# Patient Record
Sex: Male | Born: 1955 | Race: Black or African American | Hispanic: No | Marital: Single
Health system: Southern US, Community
[De-identification: ages and names within clinical notes are randomized; demographics above are authoritative.]

## PROBLEM LIST (undated history)

## (undated) ENCOUNTER — Ambulatory Visit (HOSPITAL_COMMUNITY): Admission: EM | Payer: Self-pay | Source: Home / Self Care

---

## 1999-03-02 ENCOUNTER — Encounter: Admission: RE | Admit: 1999-03-02 | Discharge: 1999-04-05 | Payer: Self-pay | Admitting: *Deleted

## 1999-04-08 ENCOUNTER — Encounter: Admission: RE | Admit: 1999-04-08 | Discharge: 1999-04-08 | Payer: Self-pay | Admitting: *Deleted

## 1999-04-08 ENCOUNTER — Encounter: Payer: Self-pay | Admitting: *Deleted

## 1999-04-21 ENCOUNTER — Encounter: Admission: RE | Admit: 1999-04-21 | Discharge: 1999-05-04 | Payer: Self-pay | Admitting: *Deleted

## 2003-10-16 ENCOUNTER — Emergency Department (HOSPITAL_COMMUNITY): Admission: EM | Admit: 2003-10-16 | Discharge: 2003-10-16 | Payer: Self-pay | Admitting: Emergency Medicine

## 2003-11-13 ENCOUNTER — Ambulatory Visit (HOSPITAL_COMMUNITY): Admission: RE | Admit: 2003-11-13 | Discharge: 2003-11-14 | Payer: Self-pay | Admitting: Neurosurgery

## 2005-01-09 ENCOUNTER — Emergency Department (HOSPITAL_COMMUNITY): Admission: EM | Admit: 2005-01-09 | Discharge: 2005-01-09 | Payer: Self-pay | Admitting: Emergency Medicine

## 2005-08-30 ENCOUNTER — Emergency Department (HOSPITAL_COMMUNITY): Admission: EM | Admit: 2005-08-30 | Discharge: 2005-08-30 | Payer: Self-pay | Admitting: Family Medicine

## 2006-12-17 ENCOUNTER — Emergency Department (HOSPITAL_COMMUNITY): Admission: EM | Admit: 2006-12-17 | Discharge: 2006-12-17 | Payer: Self-pay | Admitting: Family Medicine

## 2007-03-22 ENCOUNTER — Ambulatory Visit: Payer: Self-pay | Admitting: Pulmonary Disease

## 2007-03-22 ENCOUNTER — Emergency Department (HOSPITAL_COMMUNITY): Admission: EM | Admit: 2007-03-22 | Discharge: 2007-03-23 | Payer: Self-pay | Admitting: Emergency Medicine

## 2008-07-06 DEATH — deceased

## 2009-08-15 IMAGING — CT CT CHEST W/ CM
2 of 3 series · 15 of 36 positions shown, 18 images · IV contrast (APPLIED)
Comparison: No prior chest CT. Chest x-ray earlier today correlated.

CLINICAL DATA: Cough, fever. Abnormal chest x-ray earlier.

CT CHEST WITH CONTRAST  03/22/2007:
TECHNIQUE: Multidetector CT imaging of the chest and upper abdomen was
performed during bolus administration of intravenous contrast.
Contrast:  100 cc Omnipaque 300.

[Series 2: routine chest 5.0 st · axial · 0.65mm/px · z∈[-284,+1]mm · 12 of 69 slices shown, 15 images]
[im 6/69  mediastinal]
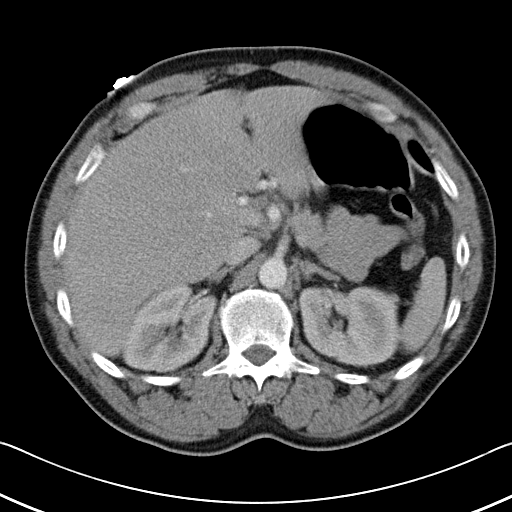
[im 6/69  lung]
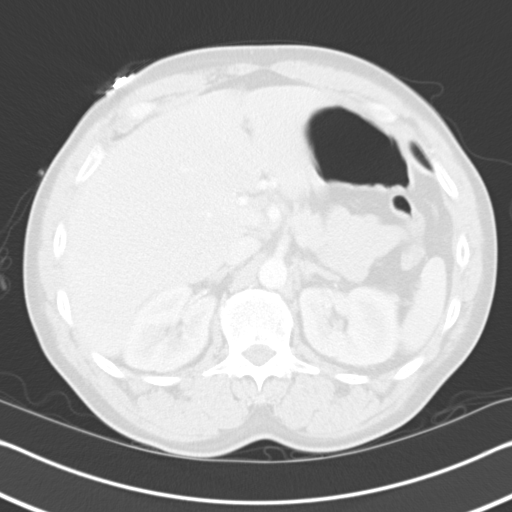
[im 11/69  lung]
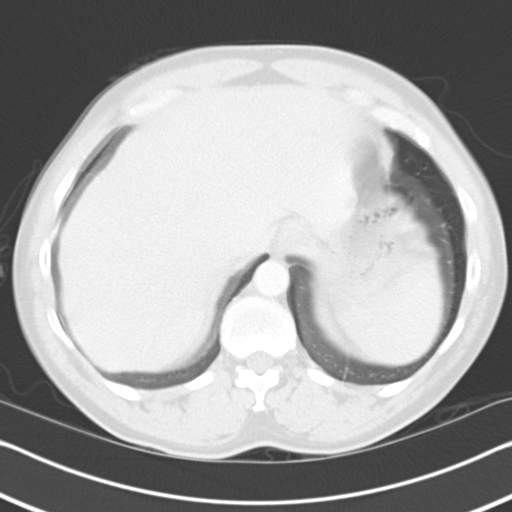
[im 16/69  lung]
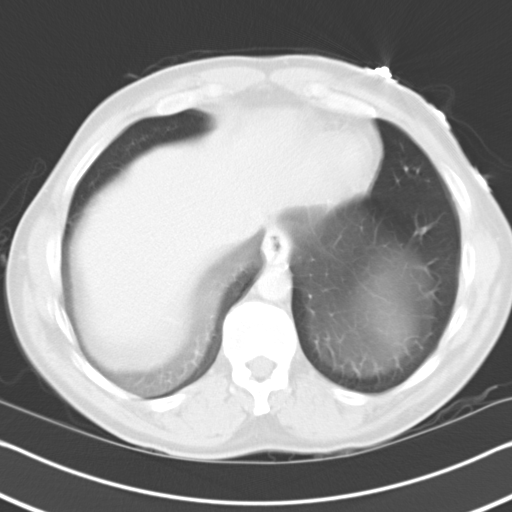
[im 21/69  lung]
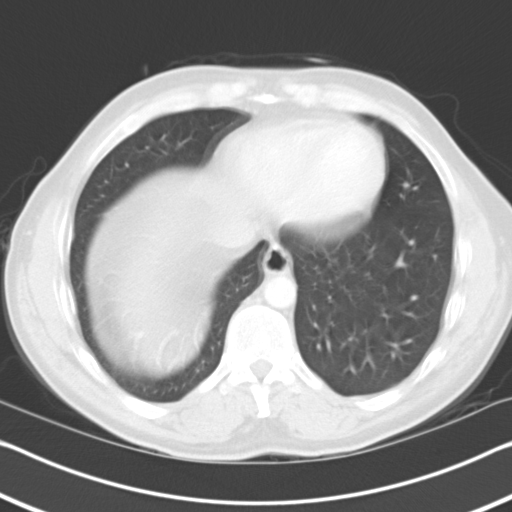
[im 26/69  mediastinal]
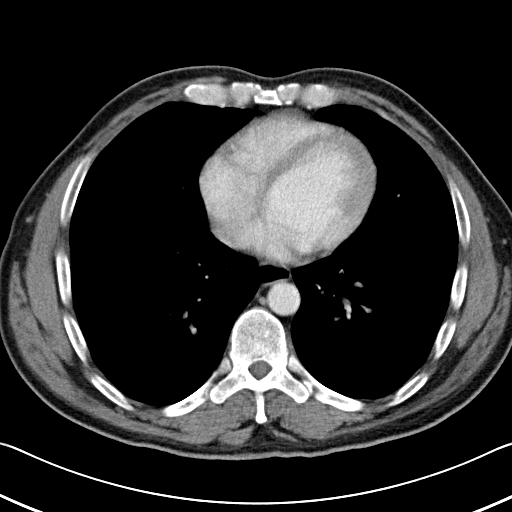
[im 26/69  lung]
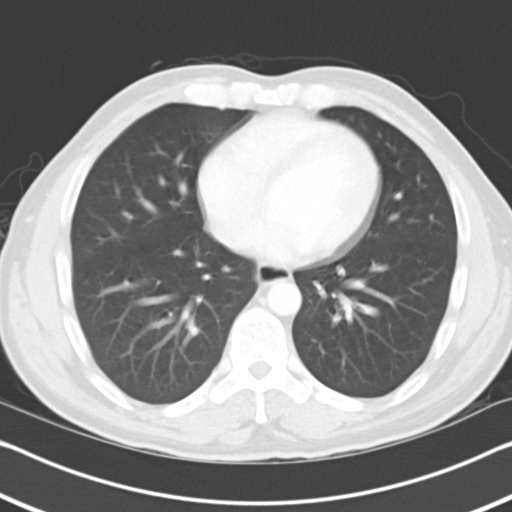
[im 31/69  lung]
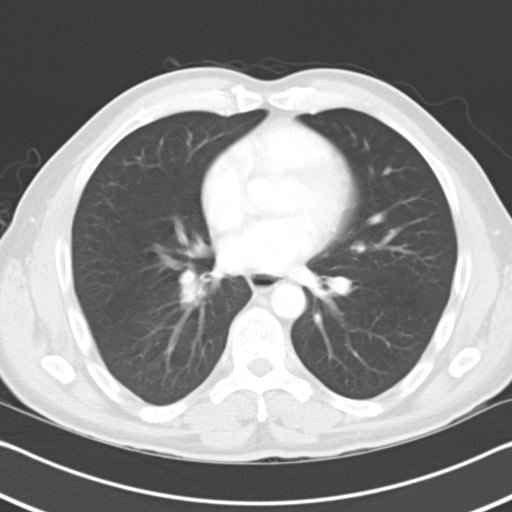
[im 38/69  lung]
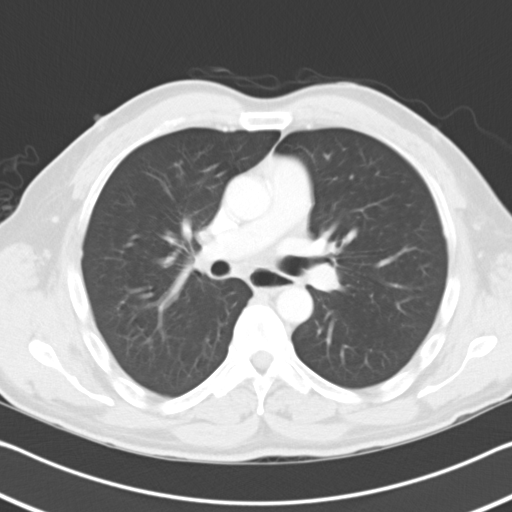
[im 43/69  lung]
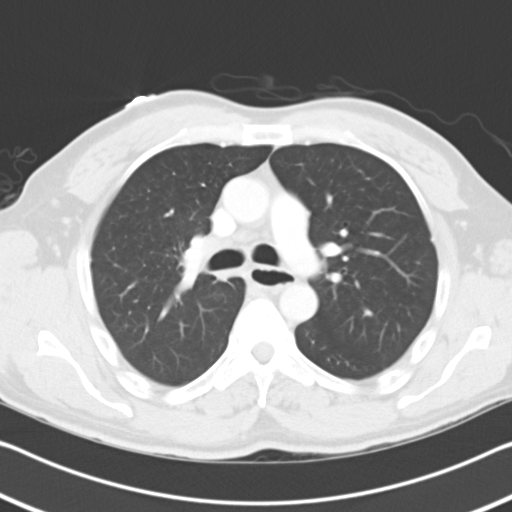
[im 48/69  mediastinal]
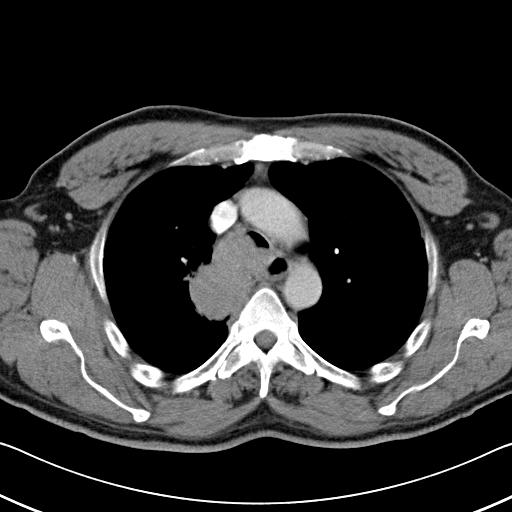
[im 48/69  lung]
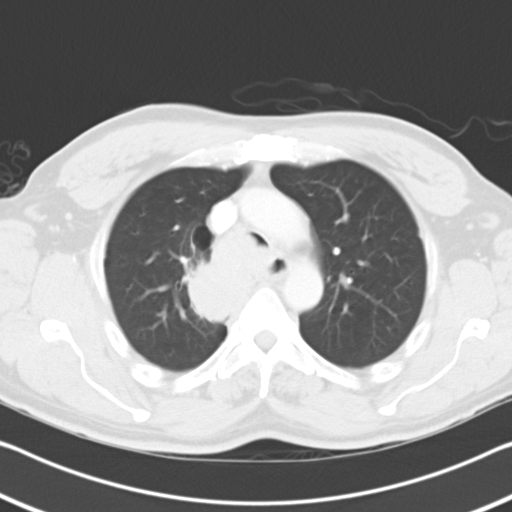
[im 53/69  lung]
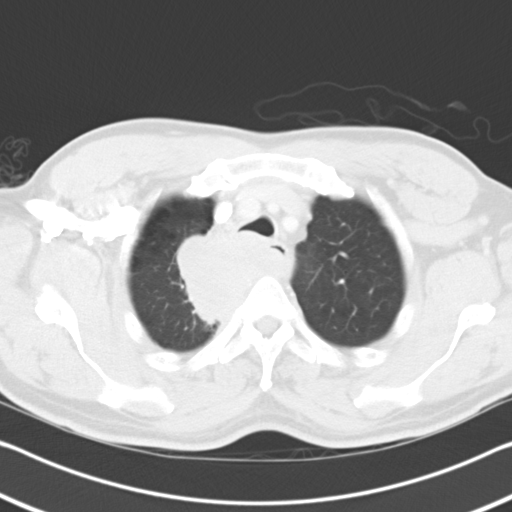
[im 58/69  lung]
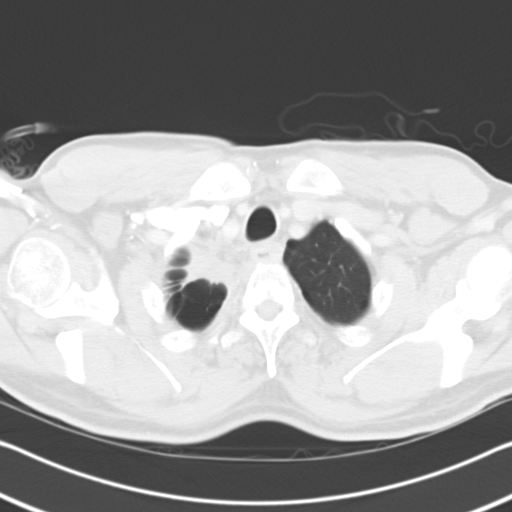
[im 63/69  lung]
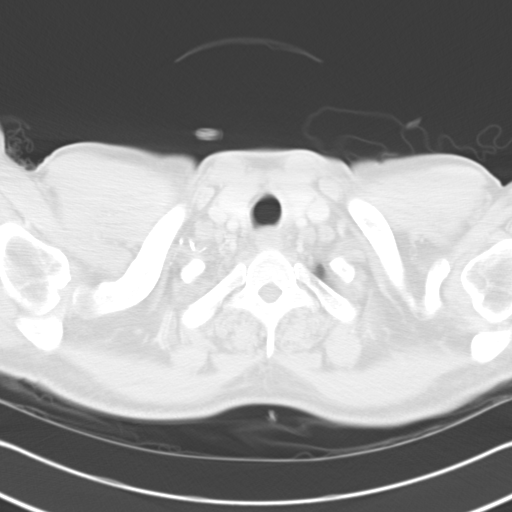

[Series 5: routine chest 2.0 st · coronal · 0.79mm/px · 3 of 124 slices shown]
[im 25/124  lung]
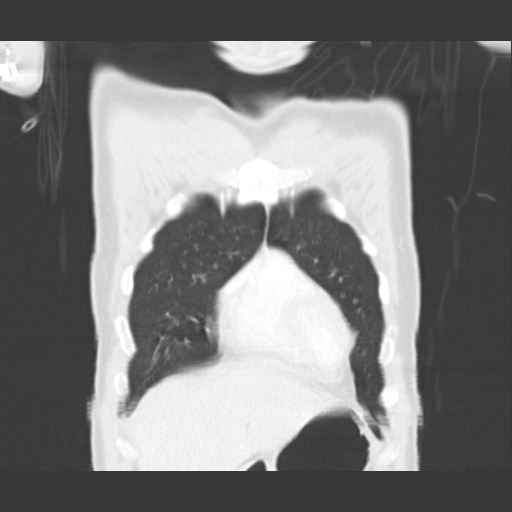
[im 50/124  lung]
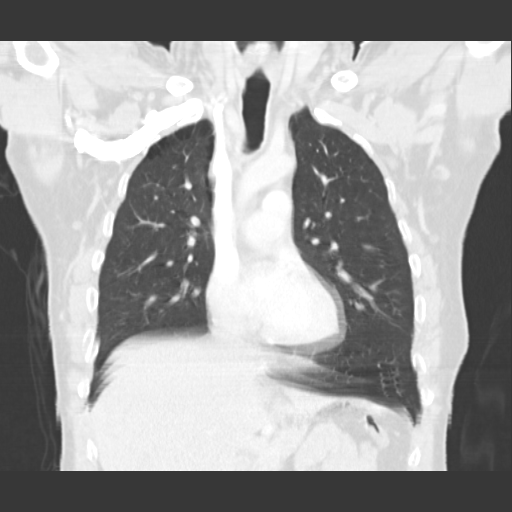
[im 74/124  lung]
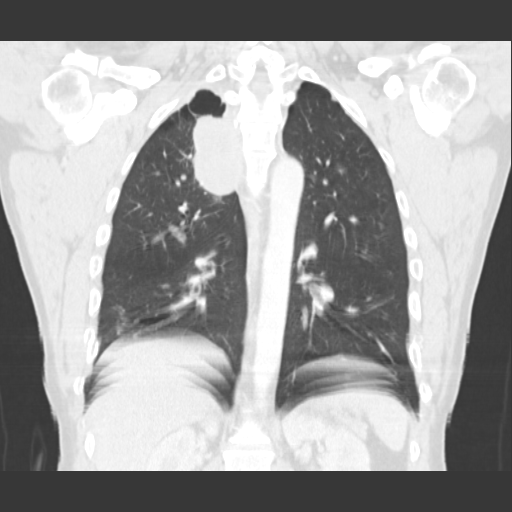

[15 of 36 positions shown; findings below may reference images not displayed]

FINDINGS: Large mass arising from the medial right upper lobe with extension
into the mediastinum measuring approximately 6.4 x 6.9 x 7.2 cm. Mass extends
medially to abut the aortic arch. Mass compresses the lower trachea just above
the carina such that it has a slit-like appearance at this level. Normal sized
precarinal lymph node present. No significant hilar lymphadenopathy or axillary
lymphadenopathy.

Multiple blebs in the right lung apex. No other pulmonary parenchymal nodules or
masses. No localized air space consolidation. No pleural effusions.

Normal heart size. No visible coronary artery calcification. No pericardial
effusion.

Images of the upper abdomen demonstrating an approximately 2.4 x 2.1 cm right
lobe liver metastasis. Two small foci of accessory splenic tissue noted adjacent
to the inferior tip of the spleen. Remaining visualized upper abdomen
unremarkable. Bone window images demonstrating no evidence of osseous metastatic
disease.
IMPRESSION: 1. Large mass arising from medial right upper lobe with extension into the
mediastinum, measurements given above. Mass markedly compresses the lower
trachea just above the carina such that it has a slit-like appearance. Does the
patient have stridor?
2. Normal sized precarinal lymph node. No significant lymphadenopathy elsewhere
in the mediastinum, hila, or axilla.
3. Emphysema with right apical blebs.
4. No pulmonary nodules elsewhere.
5. Right lobe liver metastasis.

Results were telephoned to Dr. Mario Sebastian of the [HOSPITAL] the time
of interpretation 03/22/2007 at 1524 hours, specifically with regard to the
marked tracheal narrowing.

## 2010-10-18 NOTE — H&P (Signed)
NAME:  Jerry Stephens, Jerry Stephens             ACCOUNT NO.:  [id number]   MEDICAL RECORD NO.:  [id number]          PATIENT TYPE:  EMS   LOCATION:  MAJO                         FACILITY:  [facility]   PHYSICIAN:  Oretha Milch, MD      DATE OF BIRTH:  1956/04/29   DATE OF ADMISSION:  03/22/2007  DATE OF DISCHARGE:  03/23/2007                              HISTORY & PHYSICAL   HISTORY OF PRESENT ILLNESS:  Jerry Stephens is a 55 year old heavy smoker  who presented to the ED with acute onset of cough for the last 3 days  and dyspnea.  A chest x-ray showed a large right upper lobe mass  extending in the paratracheal region.  CT of the chest showed a large  mass in the right upper lobe, 6.4 x 6.9 x 7.2 cm.  This mass was  compressing the trachea and left only a slit-like appearance at the  carina.  A liver lesion was also visualized measuring 2.4 x 2.1 cm.  Due  to impending tracheal obstruction, we were asked to consult.   On my evaluation, the patient appears acutely dyspneic with frequent  paroxysms of coughing.   PAST MEDICAL HISTORY:  None.   PAST SURGICAL HISTORY:  None.   ALLERGIES:  NONE CURRENTLY.   MEDICATIONS:  Mucinex DM.   SOCIAL HISTORY:  Smokes about 2 packs per day.  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   FAMILY HISTORY:  Noncontributory.   REVIEW OF SYSTEMS:  With acute cough, unable to speak in full sentences.   PHYSICAL EXAMINATION:  VITAL SIGNS:  Temperature 99.4, blood pressure  127/83, heart rate 103, respirations 22 per minute, oxygen saturation  94% on six liters nasal cannula.  HEENT:  Pupils equally round and reactive to light.  NECK:  Supple.  Distended chest veins.  CVS:  S1, S2 tachy.  CHEST:  Diffuse inspiratory and expiratory rhonchi, which can also be  heard in the upper airway.  ABDOMEN:  Soft, nontender.  NEUROLOGICALLY:  Nonfocal.  EXTREMITIES:  No edema.   LABORATORY DATA:  WBC count 21.7, hemoglobin 13.4, platelets 437,000,  81% neutrophils.  Sodium 130, potassium 3.9, bicarbonate 26, chloride  100, BUN 9.   IMPRESSION:  1. 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  2. Likely chronic obstructive pulmonary disease.   RECOMMENDATIONS:  1. Clearly relieving the tracheal obstruction is primary here.      Unfortunately, this is an extremely difficult situation, and I do      not think even endotracheal intubation will be of much benefit here      unless the obstruction is relieved.  I discussed this with Dr.      Edwyna Shell from thoracic surgery.  Our options include rigid      bronchoscopy with either laser or coring out of the primary tumor.      Unfortunately, this is likely to be complicated by bleeding and      severe edema of the airways.  Dr. Coralee North  tells me that he does      not have a stent available that he can place in such a situation.  2. I have also discussed this situation with Dr. Fonnie Jarvis of radiation      oncology.  She will review the CT scan and let us know if radiation      is an option.  I am also making attempts at the same time to      contact [facility] to transfer this patient to the appropriate      service.  Further management will be discussed based on the results      of the above efforts.      Oretha Milch, MD  Electronically Signed     RVA/MEDQ  D:  03/22/2007  T:  03/24/2007  Job:  [id number]

## 2010-10-21 NOTE — Op Note (Signed)
NAME:  Jerry Stephens, Jerry Stephens                       ACCOUNT NO.:  [id number]   MEDICAL RECORD NO.:  [id number]                   PATIENT TYPE:  OIB   LOCATION:  5020                                 FACILITY:  [facility]   PHYSICIAN:  Danae Orleans. Venetia Maxon, M.D.               DATE OF BIRTH:  Apr 17, 1956   DATE OF PROCEDURE:  11/13/2003  DATE OF DISCHARGE:                                 OPERATIVE REPORT   PREOPERATIVE DIAGNOSIS:  Herniated cervical disk with cervical spondylosis,  degenerative disk disease and cervical radiculopathy, C6-7 level.   POSTOPERATIVE DIAGNOSIS:  Herniated cervical disk with cervical spondylosis,  degenerative disk disease and cervical radiculopathy, C6-7 level.   OPERATION PERFORMED:  Anterior cervical decompression and fusion C6-7 with  allograft, bone graft and anterior cervical plate.   SURGEON:  Danae Orleans. Venetia Maxon, M.D.   ANESTHESIA:  General endotracheal.   ESTIMATED BLOOD LOSS:  50 mL.   COMPLICATIONS:  None.   DISPOSITION:  Recovery.   INDICATIONS FOR PROCEDURE:  Jerry Stephens is a 55 year old with left arm  pain and left C7 distribution weakness.  He has a foraminal disk herniation  with significant uncinate spurring at the C6-7 level causing left C7 nerve  root compression.  It was elected to take him to surgery for anterior  cervical decompression and fusion at the C6-7 level.   DESCRIPTION OF PROCEDURE:  Jerry Stephens was brought to the operating room.  Following satisfactory and uncomplicated induction of general endotracheal  anesthesia and placement of intravenous lines, the patient was placed in  supine position on the operating table.  The neck was placed in slight  extension and he was placed in 10 pounds of halter traction.  The anterior  neck was then shaved, prepped and draped in the usual sterile fashion.  The  area of planned incision was infiltrated with 0.25% Marcaine and 0.5%  lidocaine 1:200,000 epinephrine.  Incision was made in the  midline to the  anterior border of the sternocleidomastoid muscle, carried sharply through  the platysma layer.  Subplatysmal dissection was performed exposing the  anterior border of the sternocleidomastoid muscle. Using blunt dissection,  the carotid sheath was kept lateral, the trachea and esophagus kept medial  and the anterior cervical spine was identified.  A bent spinal needle was  placed at what was felt to be the C6-7 level.  Intraoperative x-ray  demonstrated the needle to be at the C7-T1 level.  Consequently the exposure  was carried just one level up and ventral osteophyte was removed with  Leksell rongeur.  The longus colli muscles were taken down from the anterior  cervical spine using electrocautery and Key elevator.  Self-retaining  retractor was placed to facilitate exposure.  The interspace was then  incised and disk material was removed in piecemeal fashion.  A disk space  spreader was placed and using microscopic visualization and a high speed  drill, the end plates of  C6 and C7 were decorticated and uncinate spurs were  drilled down.  The ventral spinal cord dura was then decompressed as were  the C7 nerve roots as they extended out the neural foramina.  On the left  there was evidence of soft disk material directly overlying the nerve root  and this was decompressed widely.  Hemostasis was assured with Gelfoam  soaked in Thrombin.  A 7 mm prefashioned cortical bone graft was packed with  the morcelized drillings and spurs which had been preserved from bone  removal and this was then tamped into position and countersunk  appropriately.  An Alphatek 16 mm anterior cervical plate was then affixed  to the anterior cervical spine using 14 mm variable angle screws, two at C6,  two at C7.  Locking mechanisms were engaged.  Final x-ray confirmed  positioning of bone graft and anterior cervical plate.  Prior to placing the  plate, the traction was removed.  The wound was then  copiously irrigated  with bacitracin saline.  Soft tissues were inspected and found to be in good  repair.  The platysma layer was closed with 3-0 Vicryl sutures and skin  edges were reapproximated with running 4-0 Vicryl subcuticular stitch.  The  wound was dressed with Dermabond.  The patient was extubated in the  operating room and taken to the recovery room in stable and satisfactory  condition having tolerated the operation well.  Counts were correct at the  end of the case.                                               Danae Orleans. Venetia Maxon, M.D.    JDS/MEDQ  D:  11/13/2003  T:  11/14/2003  Job:  [id number]

## 2011-03-15 LAB — DIFFERENTIAL
Basophils Absolute: 0.1
Basophils Relative: 0
Eosinophils Absolute: 0.1
Eosinophils Relative: 0
Monocytes Relative: 7

## 2011-03-15 LAB — SEDIMENTATION RATE: Sed Rate: 80 — ABNORMAL HIGH

## 2011-03-15 LAB — CULTURE, BLOOD (ROUTINE X 2)
Culture: NO GROWTH
Culture: NO GROWTH

## 2011-03-15 LAB — CBC
HCT: 39.8
MCHC: 33.7
MCV: 88.7
WBC: 21.7 — ABNORMAL HIGH

## 2011-03-15 LAB — I-STAT 8, (EC8 V) (CONVERTED LAB)
BUN: 9
Bicarbonate: 25.7 — ABNORMAL HIGH
Hemoglobin: 16
Operator id: 116391
TCO2: 27
pCO2, Ven: 33.7 — ABNORMAL LOW

## 2014-10-16 ENCOUNTER — Ambulatory Visit: Payer: Self-pay | Admitting: Surgery

## 2014-10-16 NOTE — H&P (Signed)
History of Present Illness Jerry Stephens(Jerry Stephens K. Ilissa Rosner MD; 10/16/2014 9:15 AM) Patient words: New Patient.  The patient is a 59 year old male who presents with a complaint of Mass. Referred by Dr. Renaye RakersVeita Bland for evaluation of left shoulder mass. This is a 59 year old with some pre-existing cardiac comorbidities who presents with at least 10 years of an enlarging mass on his anterior left shoulder. This has become fairly large and tender. It has never become inflamed. This has never been biopsied or imaged. He has another similar sized mass on his right side near his right hip. This has also become uncomfortable. He would like to have both of these removed. His cardiologist is Dr. Parke Simmersalton Maclean Other Problems Jerry Stephens(Jerry Sakowski, LPN; 1/61/09605/13/2016 4:548:51 AM) Back Pain Chest pain Congestive Heart Failure Diabetes Mellitus High blood pressure Myocardial infarction  Past Surgical History Jerry Stephens(Jerry Sakowski, LPN; 0/98/11915/13/2016 4:788:51 AM) Resection of Stomach  Diagnostic Studies History Jerry Stephens(Jerry Sakowski, LPN; 2/95/62135/13/2016 0:868:51 AM) Colonoscopy 1-5 years ago  Allergies Jerry Stephens(Jerry Sakowski, LPN; 5/78/46965/13/2016 2:958:52 AM) Ciprofloxacin *CHEMICALS* Flagyl *ANTI-INFECTIVE AGENTS - MISC.*  Medication History Jerry Stephens(Jerry Sakowski, LPN; 2/84/13245/13/2016 4:018:53 AM) BiDil (20-37.5MG  Tablet, Oral daily) Active. Carvedilol (25MG  Tablet, Oral daily) Active. Lisinopril (10MG  Tablet, Oral daily) Active. MetFORMIN HCl (500MG  Tablet, Oral daily) Active. Spiriva HandiHaler (18MCG Capsule, Inhalation) Active. Spironolactone (25MG  Tablet, Oral daily) Active. Symbicort (160-4.5MCG/ACT Aerosol, Inhalation daily) Active. Lomotil (2.5-0.025MG  Tablet, Oral) Active. Aspirin Low Strength (81MG  Tablet Chewable, Oral) Active.  Social History Jerry Stephens(Jerry Sakowski, LPN; 0/27/25365/13/2016 6:448:51 AM) Alcohol use Occasional alcohol use. Caffeine use Coffee. No drug use Tobacco use Current some day smoker.  Family History Jerry Stephens(Jerry Sakowski, LPN; 0/34/74255/13/2016 9:568:51  AM) Kidney Disease Sister. Respiratory Condition Brother, Father.     Review of Systems Jerry Stephens([facility] L[facility]; 3/87/56435/13/2016 3:298:51 AM) General Present- Weight Gain. Not Present- Appetite Loss, Chills, Fatigue, Fever, Night Sweats and Weight Loss. Skin Present- Dryness. Not Present- Change in Wart/Mole, Hives, Jaundice, New Lesions, Non-Healing Wounds, Rash and Ulcer. HEENT Not Present- Earache, Hearing Loss, Hoarseness, Nose Bleed, Oral Ulcers, Ringing in the Ears, Seasonal Allergies, Sinus Pain, Sore Throat, Visual Disturbances, Wears glasses/contact lenses and Yellow Eyes. Respiratory Present- Difficulty Breathing. Not Present- Bloody sputum, Chronic Cough, Snoring and Wheezing. Cardiovascular Present- Chest Pain, Difficulty Breathing Lying Down, Leg Cramps, Rapid Heart Rate, Shortness of Breath and Swelling of Extremities. Not Present- Palpitations. Gastrointestinal Present- Hemorrhoids and Rectal Pain. Not Present- Abdominal Pain, Bloating, Bloody Stool, Change in Bowel Habits, Chronic diarrhea, Constipation, Difficulty Swallowing, Excessive gas, Gets full quickly at meals, Indigestion, Nausea and Vomiting. Male Genitourinary Present- Nocturia. Not Present- Blood in Urine, Change in Urinary Stream, Frequency, Impotence, Painful Urination, Urgency and Urine Leakage.  Vitals Jerry Stephens([facility] L[facility]; 5/18/84165/13/2016 6:068:52 AM) 10/16/2014 8:51 AM Weight: 186.25 lb Height: 71in Body Surface Area: 2.06 m Body Mass Index: 25.98 kg/m Temp.: 97.57F(Oral)  Pulse: 72 (Regular)  BP: 142/70 (Sitting, Right Arm, Standard)     Physical Exam Jerry Stephens(Gleason Ardoin K. Lional Icenogle MD; 10/16/2014 9:16 AM)  The physical exam findings are as follows: Note:WDWN in NAD HEENT: EOMI, sclera anicteric Neck: No masses, no thyromegaly Shoulder - left anterior - 8 cm subcutaneous mass; protruding; well-demarcated; mildly tender Lungs: CTA bilaterally; normal respiratory effort CV: Regular rate and rhythm; no murmurs Abd: +bowel  sounds, soft, non-tender, no masses Right hip near anterior superior iliac spine - 8 cm subcutaneous mass; protruding; well-demarcated; mildly tender Ext: Well-perfused; no edema Skin: Warm, dry; no sign of jaundice    Assessment & Plan Jerry Stephens(Arianah Torgeson K. 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 The surgical procedure has been discussed with the patient. Potential risks, benefits, alternative treatments, and expected outcomes have been explained. All of the patient's questions at this time have been answered. The likelihood of reaching the patient's treatment goal is good. The patient understand the proposed surgical procedure and wishes to proceed.   Jerry ArmsMatthew K. Corliss Skainssuei, MD, [facility]  General/ Trauma Surgery  10/16/2014 9:16 AM
